# Patient Record
Sex: Male | Born: 1945 | Race: White | Hispanic: No | Marital: Married | Smoking: Never smoker
Health system: Southern US, Community
[De-identification: ages and names within clinical notes are randomized; demographics above are authoritative.]

## PROBLEM LIST (undated history)

## (undated) DIAGNOSIS — E7800 Pure hypercholesterolemia, unspecified: Secondary | ICD-10-CM

## (undated) DIAGNOSIS — K219 Gastro-esophageal reflux disease without esophagitis: Secondary | ICD-10-CM

## (undated) HISTORY — PX: COLONOSCOPY: SHX174

## (undated) HISTORY — PX: TENDON REPAIR: SHX5111

---

## 2017-05-29 ENCOUNTER — Emergency Department (HOSPITAL_COMMUNITY): Payer: Medicare Other

## 2017-05-29 ENCOUNTER — Emergency Department (HOSPITAL_COMMUNITY)
Admission: EM | Admit: 2017-05-29 | Discharge: 2017-05-30 | Disposition: A | Discharge status: Home / Self Care | Payer: Medicare Other | Attending: Emergency Medicine | Admitting: Emergency Medicine

## 2017-05-29 ENCOUNTER — Encounter (HOSPITAL_COMMUNITY): Payer: Self-pay | Admitting: *Deleted

## 2017-05-29 ENCOUNTER — Other Ambulatory Visit: Payer: Self-pay

## 2017-05-29 DIAGNOSIS — R079 Chest pain, unspecified: Secondary | ICD-10-CM | POA: Insufficient documentation

## 2017-05-29 HISTORY — DX: Pure hypercholesterolemia, unspecified: E78.00

## 2017-05-29 HISTORY — DX: Gastro-esophageal reflux disease without esophagitis: K21.9

## 2017-05-29 LAB — BASIC METABOLIC PANEL
Anion gap: 11 (ref 5–15)
BUN: 21 mg/dL — AB (ref 6–20)
CO2: 25 mmol/L (ref 22–32)
CREATININE: 1.1 mg/dL (ref 0.61–1.24)
Calcium: 9.1 mg/dL (ref 8.9–10.3)
Chloride: 102 mmol/L (ref 101–111)
Glucose, Bld: 125 mg/dL — ABNORMAL HIGH (ref 65–99)
Potassium: 3.5 mmol/L (ref 3.5–5.1)
Sodium: 138 mmol/L (ref 135–145)

## 2017-05-29 LAB — I-STAT TROPONIN, ED: TROPONIN I, POC: 0.01 ng/mL (ref 0.00–0.08)

## 2017-05-29 NOTE — ED Triage Notes (Addendum)
Pt was playing a card game with family and had a sudden onset of chest tightness. Reports SOB as well. Pt took 325mg  ASA

## 2017-05-30 LAB — CBC
HCT: 38.8 % — ABNORMAL LOW (ref 39.0–52.0)
Hemoglobin: 13.5 g/dL (ref 13.0–17.0)
MCH: 31.9 pg (ref 26.0–34.0)
MCHC: 34.8 g/dL (ref 30.0–36.0)
MCV: 91.7 fL (ref 78.0–100.0)
PLATELETS: 196 10*3/uL (ref 150–400)
RBC: 4.23 MIL/uL (ref 4.22–5.81)
RDW: 12.8 % (ref 11.5–15.5)
WBC: 6.3 10*3/uL (ref 4.0–10.5)

## 2017-05-30 LAB — I-STAT TROPONIN, ED
TROPONIN I, POC: 0 ng/mL (ref 0.00–0.08)
Troponin i, poc: 0 ng/mL (ref 0.00–0.08)

## 2017-05-30 NOTE — ED Notes (Signed)
PT states understanding of care given, follow up care. PT ambulated from ED to car with a steady gait.  

## 2017-05-30 NOTE — ED Provider Notes (Signed)
[facility]   CSN: [id number] Arrival date & time: 05/29/17  2321     History   Chief Complaint Chief Complaint  Patient presents with  . Chest Pain    HPI Wesley Mclaughlin is a 72 y.o. male.  Patient is a 72 year old male with past medical history of hypertension, GERD, and hypothyroidism.  He presents for evaluation of chest discomfort.  This started approximately 1015 this evening while playing Monopoly with family.  He describes it as a tightness in the center of his chest with associated shortness of breath, but no nausea, diaphoresis, or radiation to the arm or jaw.  He denies any recent exertional symptoms.  He denies any recent illness such as cough, fever.  He denies any recent injury or trauma.  Symptoms lasted for approximately 90 minutes, then seemed to resolve.   The history is provided by the patient.  Chest Pain   This is a new problem. Episode onset: 1015 this evening. The problem occurs constantly. The problem has been resolved. The pain is present in the substernal region. The pain is moderate. Quality: Tightness. The pain does not radiate. Associated symptoms include shortness of breath. Pertinent negatives include no diaphoresis and no nausea. He has tried nothing for the symptoms. The treatment provided no relief.    Past Medical History:  Diagnosis Date  . Acid reflux   . Hypercholesterolemia     There are no active problems to display for this patient.   Past Surgical History:  Procedure Laterality Date  . COLONOSCOPY    . TENDON REPAIR         Home Medications    Prior to Admission medications   Not on File    Family History No family history on file.  Social History Social History   Tobacco Use  . Smoking status: Never Smoker  . Smokeless tobacco: Never Used  Substance Use Topics  . Alcohol use: No    Frequency: Never  . Drug use: No     Allergies   Patient has no known  allergies.   Review of Systems Review of Systems  Constitutional: Negative for diaphoresis.  Respiratory: Positive for shortness of breath.   Cardiovascular: Positive for chest pain.  Gastrointestinal: Negative for nausea.  All other systems reviewed and are negative.    Physical Exam Updated Vital Signs BP (!) 206/74 (BP Location: Right Arm)   Pulse 76   Temp 97.7 F (36.5 C) (Oral)   Resp 16   SpO2 100%   Physical Exam  Constitutional: He is oriented to person, place, and time. He appears well-developed and well-nourished. No distress.  HENT:  Head: Normocephalic and atraumatic.  Mouth/Throat: Oropharynx is clear and moist.  Neck: Normal range of motion. Neck supple.  Cardiovascular: Normal rate and regular rhythm. Exam reveals no friction rub.  No murmur heard. Pulmonary/Chest: Effort normal and breath sounds normal. No respiratory distress. He has no wheezes. He has no rales.  Abdominal: Soft. Bowel sounds are normal. He exhibits no distension. There is no tenderness.  Musculoskeletal: Normal range of motion. He exhibits no edema.       Right lower leg: Normal. He exhibits no tenderness and no edema.       Left lower leg: Normal. He exhibits no tenderness and no edema.  Neurological: He is alert and oriented to person, place, and time. Coordination normal.  Skin: Skin is warm and dry. He is not diaphoretic.  Nursing note  and vitals reviewed.    ED Treatments / Results  Labs (all labs ordered are listed, but only abnormal results are displayed) Labs Reviewed  BASIC METABOLIC PANEL - Abnormal; Notable for the following components:      Result Value   Glucose, Bld 125 (*)    BUN 21 (*)    All other components within normal limits  CBC - Abnormal; Notable for the following components:   HCT 38.8 (*)    All other components within normal limits  I-STAT TROPONIN, ED    EKG  EKG Interpretation  Date/Time:  Saturday May 29 2017 23:25:27 EST Ventricular  Rate:  73 PR Interval:  190 QRS Duration: 100 QT Interval:  424 QTC Calculation: 467 R Axis:   44 Text Interpretation:  Normal sinus rhythm Normal ECG Confirmed by Geoffery Lyons ([id number]) on 05/29/2017 11:45:17 PM       Radiology Dg Chest 2 View  Result Date: 05/30/2017 CLINICAL DATA:  72 year old male with chest pain. EXAM: CHEST  2 VIEW COMPARISON:  None. FINDINGS: The heart size and mediastinal contours are within normal limits. Both lungs are clear. The visualized skeletal structures are unremarkable. IMPRESSION: No active cardiopulmonary disease. Electronically Signed   By: Elgie Collard M.D.   On: 05/30/2017 00:03    Procedures Procedures (including critical care time)  Medications Ordered in ED Medications - No data to display   Initial Impression / Assessment and Plan / ED Course  I have reviewed the triage vital signs and the nursing notes.  Pertinent labs & imaging results that were available during my care of the patient were reviewed by me and considered in my medical decision making (see chart for details).  Patient presents with complaints of chest tightness evening while playing a board game.  His workup thus far is unremarkable.  His EKG shows no evidence for MI or ischemia and troponin x2 is negative.  He is now symptom-free and has been so through most of his emergency department course.  I have discussed the disposition with the patient and family.  He is here from [place] visiting and plans to go home tomorrow.  He will make arrangements for follow-up with his PCP once he arrives there.  He understands to return to the emergency department if these symptoms return or worsen.  Final Clinical Impressions(s) / ED Diagnoses   Final diagnoses:  None    ED Discharge Orders    None       Geoffery Lyons, MD 05/30/17 [phone]

## 2017-05-30 NOTE — Discharge Instructions (Signed)
Follow-up with your primary doctor when you arrive home to discuss cardiology consultation.  Return to the emergency department in the meantime if your symptoms return, worsen, or change.

## 2019-01-27 IMAGING — DX DG CHEST 2V
2 series · 2 of 2 positions shown · non-contrast
Comparison: None.

CLINICAL DATA: 71-year-old male with chest pain.

EXAM:
CHEST  2 VIEW

[chest pa]
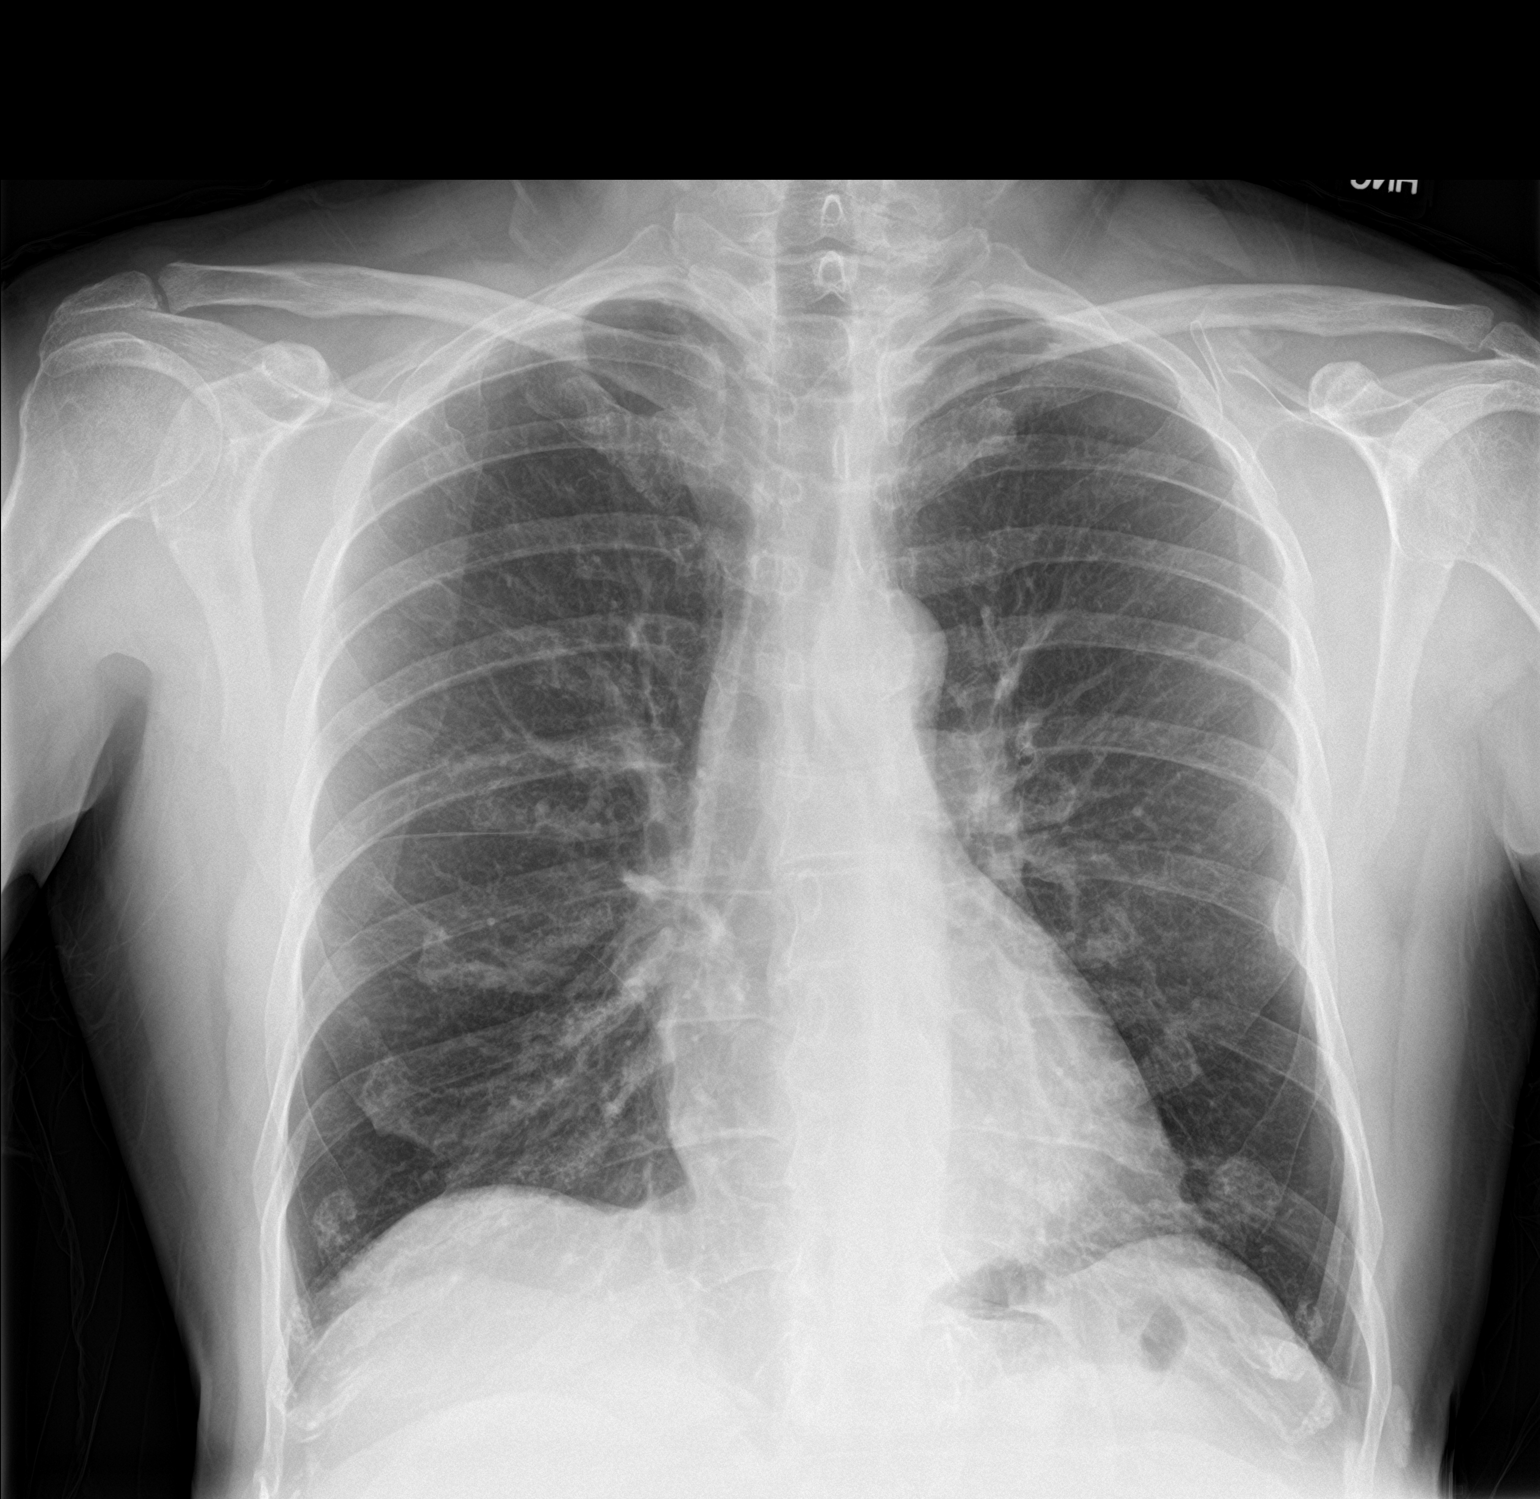

[chest lat]
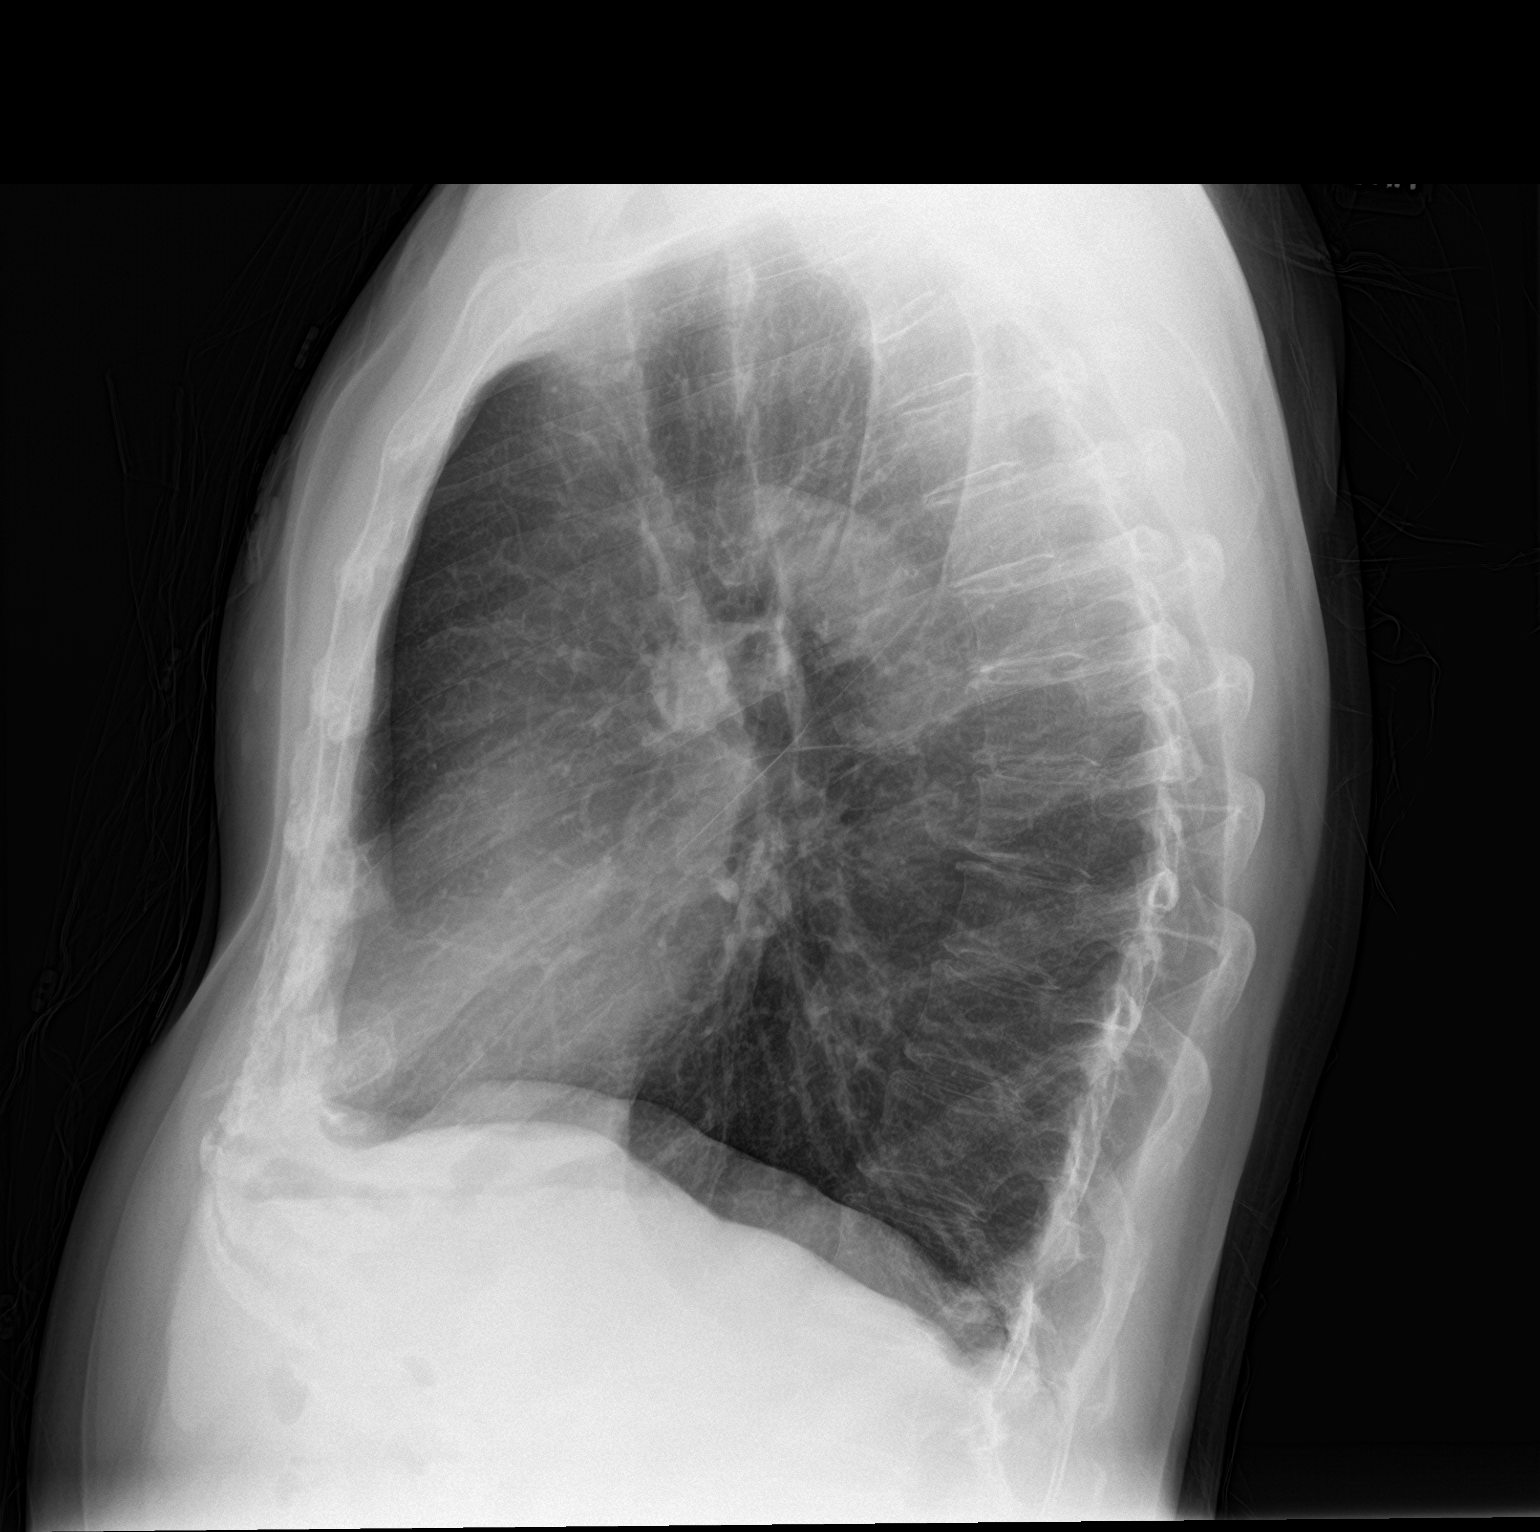

[2 of 2 positions shown; findings below may reference images not displayed]

FINDINGS: The heart size and mediastinal contours are within normal limits.
Both lungs are clear. The visualized skeletal structures are
unremarkable.
IMPRESSION: No active cardiopulmonary disease.
